# Patient Record
Sex: Female | Born: 1990 | Hispanic: No | Smoking: Current every day smoker
Health system: Southern US, Community
[De-identification: ages and names within clinical notes are randomized; demographics above are authoritative.]

## PROBLEM LIST (undated history)

## (undated) DIAGNOSIS — B009 Herpesviral infection, unspecified: Secondary | ICD-10-CM

## (undated) DIAGNOSIS — J45909 Unspecified asthma, uncomplicated: Secondary | ICD-10-CM

## (undated) DIAGNOSIS — B20 Human immunodeficiency virus [HIV] disease: Secondary | ICD-10-CM

---

## 2012-11-08 ENCOUNTER — Encounter (HOSPITAL_COMMUNITY): Payer: Self-pay

## 2012-11-08 ENCOUNTER — Inpatient Hospital Stay (HOSPITAL_COMMUNITY)
Admission: AD | Admit: 2012-11-08 | Discharge: 2012-11-09 | Disposition: A | Discharge status: Home / Self Care | Payer: Medicaid Other | Source: Ambulatory Visit | Attending: Obstetrics & Gynecology | Admitting: Obstetrics & Gynecology

## 2012-11-08 DIAGNOSIS — O9081 Anemia of the puerperium: Secondary | ICD-10-CM | POA: Insufficient documentation

## 2012-11-08 DIAGNOSIS — D649 Anemia, unspecified: Secondary | ICD-10-CM | POA: Insufficient documentation

## 2012-11-08 DIAGNOSIS — O909 Complication of the puerperium, unspecified: Secondary | ICD-10-CM | POA: Insufficient documentation

## 2012-11-08 DIAGNOSIS — G8918 Other acute postprocedural pain: Secondary | ICD-10-CM

## 2012-11-08 HISTORY — DX: Unspecified asthma, uncomplicated: J45.909

## 2012-11-08 HISTORY — DX: Human immunodeficiency virus (HIV) disease: B20

## 2012-11-08 HISTORY — DX: Herpesviral infection, unspecified: B00.9

## 2012-11-08 LAB — CBC
Hemoglobin: 9.9 g/dL — ABNORMAL LOW (ref 12.0–15.0)
MCHC: 32.9 g/dL (ref 30.0–36.0)
RDW: 14.7 % (ref 11.5–15.5)
WBC: 6.8 10*3/uL (ref 4.0–10.5)

## 2012-11-08 LAB — URINE MICROSCOPIC-ADD ON

## 2012-11-08 LAB — URINALYSIS, ROUTINE W REFLEX MICROSCOPIC
Glucose, UA: NEGATIVE mg/dL
Ketones, ur: NEGATIVE mg/dL
Leukocytes, UA: NEGATIVE
Nitrite: NEGATIVE
Protein, ur: NEGATIVE mg/dL
pH: 8 (ref 5.0–8.0)

## 2012-11-08 MED ORDER — OXYCODONE-ACETAMINOPHEN 5-325 MG PO TABS
2.0000 | ORAL_TABLET | Freq: Once | ORAL | Status: AC
  Administered 2012-11-08: 2 via ORAL
  Filled 2012-11-08: qty 2

## 2012-11-08 MED ORDER — IBUPROFEN 800 MG PO TABS
800.0000 mg | ORAL_TABLET | Freq: Once | ORAL | Status: AC
  Administered 2012-11-08: 800 mg via ORAL
  Filled 2012-11-08: qty 1

## 2012-11-08 NOTE — MAU Note (Signed)
C/section last Sunday at [facility]. Incisional pain since delivery, leaking yellow discharge since yesterday. Vaginal bleeding worsened since delivery, has had several hard clots. Constipation since delivery.

## 2012-11-08 NOTE — MAU Provider Note (Signed)
History     CSN: [id number]  Arrival date and time: 11/08/12 2215   None     Chief Complaint  Patient presents with  . Postpartum Complications  . Vaginal Bleeding  . Constipation  . Post-op Problem   HPI  Reports c/section last Sunday 11/02/12 at [facility]. + Incisional pain since delivery, leaking yellow discharge from incision since yesterday. Reports increased vaginal bleeding worsened since delivery, has had several hard clots.  States has not taken pain medication in since 1400 due to driving around in car.  Reports saturating pad every 2 hours.   Reports bowel movement after delivery, but none since.     Past Medical History  Diagnosis Date  . HIV disease   . Asthma     as child  . HSV (herpes simplex virus) infection     last outbreak 4 years ago    Past Surgical History  Procedure Date  . Cesarean section     Family History  Problem Relation Age of Onset  . Adopted: Yes    History  Substance Use Topics  . Smoking status: Never Smoker   . Smokeless tobacco: Not on file  . Alcohol Use:     Allergies: No Known Allergies  Prescriptions prior to admission  Medication Sig Dispense Refill  . docusate sodium (COLACE) 100 MG capsule Take 100 mg by mouth once.      . Elviteg-Cobicis-Emtricit-Tenof (STRIBILD PO) Take 1 tablet by mouth 3 x daily with food.      Marland Kitchen ibuprofen (ADVIL,MOTRIN) 800 MG tablet Take 800 mg by mouth every 8 (eight) hours as needed.      . magnesium hydroxide (MILK OF MAGNESIA) 400 MG/5ML suspension Take 5 mLs by mouth daily as needed.      Marland Kitchen oxyCODONE-acetaminophen (PERCOCET) 10-325 MG per tablet Take 1 tablet by mouth every 4 (four) hours as needed.      . Prenatal Vit-Fe Fumarate-FA (PRENATAL MULTIVITAMIN) TABS Take 1 tablet by mouth daily.        Review of Systems  Constitutional: Negative for fever and chills.  Gastrointestinal: Positive for abdominal pain (incisional pain) and constipation.  Genitourinary:   Vaginal bleeding, small clots  All other systems reviewed and are negative.   Physical Exam   Blood pressure 138/73, pulse 91, temperature 99.2 F (37.3 C), temperature source Oral, resp. rate 18, height 5\' 6"  (1.676 m), weight 127.007 kg (280 lb), unknown if currently breastfeeding.  Physical Exam  Constitutional: She is oriented to person, place, and time. She appears well-developed and well-nourished. No distress.  HENT:  Head: Normocephalic.  Neck: Normal range of motion. Neck supple.  Cardiovascular: Normal rate and regular rhythm.   Respiratory: Effort normal and breath sounds normal.  GI: Soft. Bowel sounds are normal. She exhibits no distension and no mass. There is no tenderness.       Incision site healing well; well approximated, no signs of infection; no palpable mass.  Genitourinary: There is bleeding (scant, no clots) around the vagina.  Musculoskeletal: Normal range of motion. She exhibits no edema.  Neurological: She is alert and oriented to person, place, and time. She has normal reflexes.  Skin: Skin is warm and dry.    MAU Course  Procedures Percocet 5/325 x 2 PO Ibuprofen 800 mg PO  Results for orders placed during the hospital encounter of 11/08/12 (from the past 24 hour(s))  URINALYSIS, ROUTINE W REFLEX MICROSCOPIC     Status: Abnormal   Collection Time  11/08/12 10:40 PM      Component Value Range   Color, Urine YELLOW  YELLOW   APPearance CLEAR  CLEAR   Specific Gravity, Urine 1.020  1.005 - 1.030   pH 8.0  5.0 - 8.0   Glucose, UA NEGATIVE  NEGATIVE mg/dL   Hgb urine dipstick LARGE (*) NEGATIVE   Bilirubin Urine NEGATIVE  NEGATIVE   Ketones, ur NEGATIVE  NEGATIVE mg/dL   Protein, ur NEGATIVE  NEGATIVE mg/dL   Urobilinogen, UA 0.2  0.0 - 1.0 mg/dL   Nitrite NEGATIVE  NEGATIVE   Leukocytes, UA NEGATIVE  NEGATIVE  URINE MICROSCOPIC-ADD ON     Status: Normal   Collection Time   11/08/12 10:40 PM      Component Value Range   Squamous Epithelial / LPF  RARE  RARE   WBC, UA 0-2  <3 WBC/hpf   RBC / HPF 3-6  <3 RBC/hpf   Urine-Other MUCOUS PRESENT    CBC     Status: Abnormal   Collection Time   11/08/12 11:16 PM      Component Value Range   WBC 6.8  4.0 - 10.5 K/uL   RBC 3.73 (*) 3.87 - 5.11 MIL/uL   Hemoglobin 9.9 (*) 12.0 - 15.0 g/dL   HCT 16.1 (*) 09.6 - 04.5 %   MCV 80.7  78.0 - 100.0 fL   MCH 26.5  26.0 - 34.0 pg   MCHC 32.9  30.0 - 36.0 g/dL   RDW 40.9  81.1 - 91.4 %   Platelets 222  150 - 400 K/uL    Assessment and Plan  Postoperative Pain Anemia  Plan: OTC Miralax Provided reassurance RX Integra Keep scheduled appointment on 11/13/12.  [facility] 11/08/2012, 11:03 PM

## 2012-11-09 DIAGNOSIS — O9081 Anemia of the puerperium: Secondary | ICD-10-CM

## 2012-11-09 DIAGNOSIS — D649 Anemia, unspecified: Secondary | ICD-10-CM

## 2012-11-09 DIAGNOSIS — O909 Complication of the puerperium, unspecified: Secondary | ICD-10-CM

## 2012-11-09 MED ORDER — INTEGRA F 125-1 MG PO CAPS
1.0000 | ORAL_CAPSULE | Freq: Every day | ORAL | Status: AC
Start: 2012-11-09 — End: ?

## 2014-08-09 ENCOUNTER — Encounter (HOSPITAL_COMMUNITY): Payer: Self-pay

## 2016-09-28 ENCOUNTER — Encounter (HOSPITAL_COMMUNITY): Payer: Self-pay | Admitting: *Deleted

## 2016-09-28 DIAGNOSIS — Y92009 Unspecified place in unspecified non-institutional (private) residence as the place of occurrence of the external cause: Secondary | ICD-10-CM | POA: Diagnosis not present

## 2016-09-28 DIAGNOSIS — Z23 Encounter for immunization: Secondary | ICD-10-CM | POA: Diagnosis not present

## 2016-09-28 DIAGNOSIS — Y939 Activity, unspecified: Secondary | ICD-10-CM | POA: Diagnosis not present

## 2016-09-28 DIAGNOSIS — F17200 Nicotine dependence, unspecified, uncomplicated: Secondary | ICD-10-CM | POA: Diagnosis not present

## 2016-09-28 DIAGNOSIS — R51 Headache: Secondary | ICD-10-CM | POA: Insufficient documentation

## 2016-09-28 DIAGNOSIS — Z79899 Other long term (current) drug therapy: Secondary | ICD-10-CM | POA: Diagnosis not present

## 2016-09-28 DIAGNOSIS — J45909 Unspecified asthma, uncomplicated: Secondary | ICD-10-CM | POA: Diagnosis not present

## 2016-09-28 DIAGNOSIS — Z91040 Latex allergy status: Secondary | ICD-10-CM | POA: Diagnosis not present

## 2016-09-28 DIAGNOSIS — Y999 Unspecified external cause status: Secondary | ICD-10-CM | POA: Diagnosis not present

## 2016-09-28 DIAGNOSIS — S0101XA Laceration without foreign body of scalp, initial encounter: Secondary | ICD-10-CM | POA: Diagnosis not present

## 2016-09-28 NOTE — ED Triage Notes (Signed)
Pt also denies LOC.  Pt is A & O x 4.
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
## 2016-09-29 ENCOUNTER — Emergency Department (HOSPITAL_COMMUNITY)
Admission: EM | Admit: 2016-09-29 | Discharge: 2016-09-29 | Disposition: A | Discharge status: Home / Self Care | Payer: Medicaid Other | Attending: Emergency Medicine | Admitting: Emergency Medicine

## 2016-09-29 ENCOUNTER — Emergency Department (HOSPITAL_COMMUNITY): Payer: Medicaid Other

## 2016-09-29 DIAGNOSIS — S0101XA Laceration without foreign body of scalp, initial encounter: Secondary | ICD-10-CM

## 2016-09-29 MED ORDER — ACETAMINOPHEN 500 MG PO TABS
1000.0000 mg | ORAL_TABLET | Freq: Once | ORAL | Status: AC
  Administered 2016-09-29: 1000 mg via ORAL
  Filled 2016-09-29: qty 2

## 2016-09-29 MED ORDER — LIDOCAINE-EPINEPHRINE (PF) 2 %-1:200000 IJ SOLN
10.0000 mL | Freq: Once | INTRAMUSCULAR | Status: AC
  Administered 2016-09-29: 10 mL
  Filled 2016-09-29: qty 20

## 2016-09-29 MED ORDER — IBUPROFEN 800 MG PO TABS: 800.0000 mg | ORAL_TABLET | Freq: Once | ORAL | Status: DC

## 2016-09-29 MED ORDER — CEPHALEXIN 500 MG PO CAPS: 500.0000 mg | ORAL_CAPSULE | Freq: Four times a day (QID) | ORAL | 0 refills | Status: AC

## 2016-09-29 MED ORDER — TETANUS-DIPHTH-ACELL PERTUSSIS 5-2.5-18.5 LF-MCG/0.5 IM SUSP
0.5000 mL | Freq: Once | INTRAMUSCULAR | Status: AC
  Administered 2016-09-29: 0.5 mL via INTRAMUSCULAR
  Filled 2016-09-29: qty 0.5

## 2016-09-29 MED ORDER — ACETAMINOPHEN 500 MG PO TABS: 1000.0000 mg | ORAL_TABLET | Freq: Four times a day (QID) | ORAL | 0 refills | Status: AC | PRN

## 2016-09-29 NOTE — ED Notes (Signed)
Dressing applied. 

## 2016-09-29 NOTE — Discharge Instructions (Signed)
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
## 2016-09-29 NOTE — ED Notes (Signed)
Suture cart at bedside 

## 2016-09-29 NOTE — ED Notes (Signed)
Pt tolerating oral fluids 

## 2016-09-29 NOTE — ED Notes (Signed)
PA at bedside.

## 2016-10-11 ENCOUNTER — Encounter (HOSPITAL_BASED_OUTPATIENT_CLINIC_OR_DEPARTMENT_OTHER): Payer: Self-pay | Admitting: *Deleted

## 2016-10-11 ENCOUNTER — Emergency Department (HOSPITAL_BASED_OUTPATIENT_CLINIC_OR_DEPARTMENT_OTHER)
Admission: EM | Admit: 2016-10-11 | Discharge: 2016-10-11 | Disposition: A | Discharge status: Home / Self Care | Payer: Medicaid Other | Attending: Emergency Medicine | Admitting: Emergency Medicine

## 2016-10-11 DIAGNOSIS — Z4802 Encounter for removal of sutures: Secondary | ICD-10-CM | POA: Diagnosis present

## 2016-10-11 DIAGNOSIS — F17200 Nicotine dependence, unspecified, uncomplicated: Secondary | ICD-10-CM | POA: Diagnosis not present

## 2016-10-11 DIAGNOSIS — B20 Human immunodeficiency virus [HIV] disease: Secondary | ICD-10-CM | POA: Diagnosis not present

## 2016-10-11 DIAGNOSIS — J45909 Unspecified asthma, uncomplicated: Secondary | ICD-10-CM | POA: Diagnosis not present

## 2016-10-11 NOTE — Discharge Instructions (Signed)
Your sutures were removed. Return with any pain, redness, or drainage from the area.

## 2016-10-11 NOTE — ED Triage Notes (Signed)
Patient is being seen for stitches removal from 2 weeks ago.
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
